# Patient Record
Sex: Female | Born: 1950
Health system: Southern US, Community
[De-identification: ages and names within clinical notes are randomized; demographics above are authoritative.]

## PROBLEM LIST (undated history)

## (undated) DIAGNOSIS — I10 Essential (primary) hypertension: Secondary | ICD-10-CM

## (undated) DIAGNOSIS — E119 Type 2 diabetes mellitus without complications: Secondary | ICD-10-CM

## (undated) HISTORY — PX: CHOLECYSTECTOMY: SHX55

---

## 2002-09-01 ENCOUNTER — Inpatient Hospital Stay (HOSPITAL_COMMUNITY): Admission: EM | Admit: 2002-09-01 | Discharge: 2002-09-02 | Payer: Self-pay | Admitting: Cardiology

## 2006-06-01 ENCOUNTER — Ambulatory Visit: Payer: Self-pay | Admitting: Cardiology

## 2006-06-07 ENCOUNTER — Ambulatory Visit: Payer: Self-pay | Admitting: Cardiology

## 2006-06-13 ENCOUNTER — Ambulatory Visit: Payer: Self-pay | Admitting: Cardiology

## 2006-06-21 ENCOUNTER — Ambulatory Visit: Payer: Self-pay | Admitting: Cardiology

## 2006-08-17 ENCOUNTER — Ambulatory Visit: Payer: Self-pay | Admitting: Cardiology

## 2006-08-21 ENCOUNTER — Emergency Department (HOSPITAL_COMMUNITY): Admission: EM | Admit: 2006-08-21 | Discharge: 2006-08-21 | Payer: Self-pay | Admitting: Family Medicine

## 2006-08-23 ENCOUNTER — Ambulatory Visit: Payer: Self-pay | Admitting: Cardiology

## 2006-08-29 ENCOUNTER — Ambulatory Visit: Payer: Self-pay | Admitting: Cardiology

## 2010-08-30 NOTE — Assessment & Plan Note (Signed)
[facility]                          [facility]   LYNEE, ROSENBACH                        MRN:          [id number]  DATE:08/23/2006                            DOB:          1950-12-04    PRIMARY CARDIOLOGIST:  Learta Codding, M.D.   REASON FOR VISIT:  Post hospital followup.   Sherri Reynolds is a 60 year old female recently seen by me here in the clinic  for post hospital followup on June 21, 2006, at which time I recommended  up-titration of Cardizem to 240 daily for better blood pressure control  as well as prophylaxis against recurrent atrial flutter.   Since then, the patient was hospitalized overnight here at [facility] when  she presented with reported profound hypotension in the field.  She was  discharged by Dr. Neita Carp after conferring informally with Dr. Andee Lineman and  the patient was instructed to start the Cardizem at the recommended dose  of 240 daily.  The patient had not yet started the increased dose of  Cardizem as I had previously recommended.  She tells me today, however,  that she did this for only 2 days but began feeling jittery all over  her body.  She therefore cut back to the previous dose of 120 daily.  She then had a somewhat similar episode just 2 days ago while she was at  work in [place].  She therefore took herself to the [facility] because she was concerned that she was having recurrent  hypotension.  She was found to have an initial blood pressure of 94/62  but a repeat reading only 5 minutes later was 158/88.  The patient tells  me today that she was not treated with any fluids and was subsequently  discharged.  Successive readings, which I have reviewed today, show  systolic readings in the 145-165 range with diastolics 90-100.   The patient tells me that she cut back on her lisinopril dose on her own  from 40 to 20 a day and has taken it in conjunction with the Cardizem  these past  two mornings.  Of the mornings of her episodes, however, she  had only taken Cardizem in the morning and the symptoms began some 2-3  hours later.  Of note, she thought that the initially episode was due to  hypoglycemia, but reportedly had a blood sugar of 157.   The patient has not had any tachypalpitations with these episodes.   CURRENT MEDICATIONS:  1. Cardizem 120 q.a.m.  2. Lisinopril 20 mg q.a.m.  3. Aspirin 81 daily.  4. Metformin 500 b.i.d.  5. Glipizide 10 b.i.d.  6. Lipitor 10 daily.  7. Actos 15 daily.   PHYSICAL EXAMINATION:  VITAL SIGNS:  Blood pressure 160/97; repeat  bilateral systolic in the 150-160 range.  Pulse 94, regular.  Weight  195.6.  GENERAL:  A 60 year old female, obese, sitting upright in no distress.  HEENT:  Normal.  NECK:  Palpable bilateral carotid pulses without bruits.  LUNGS:  Clear to auscultation.  HEART:  Regular rate and rhythm (S1,  S2).  No murmurs, rubs, or gallops.  ABDOMEN:  Protuberant, nontender.  EXTREMITIES:  No significant edema.  NEUROLOGIC:  No focal deficit.   IMPRESSION:  1. Recurrent hypotension, question secondary to dysautonomia.  2. History of typical atrial flutter.      a.     Quiescent on diltiazem.      b.     Negative recent CardioNet monitor.  3. Angiographically normal coronary angiogram, May 2004.  Negative      adenosine stress Cardiolite; ejection fraction 71%, February 2008.  4. Type 2 diabetes mellitus.  5. Hyperlipidemia.  6. Obesity.   PLAN:  Following discussion with Dr. Andee Lineman, the following  recommendations have been made.  We will check a D-dimer to exclude  pulmonary embolus as possible etiology for the hypertension, though we  feel that this is quite unlikely.  We will order 2-D echocardiogram to  rule out any structural abnormalities.  The patient has also been  advised to continue current medication regimen but to resume taking  lisinopril in the evening and to continue at the current dose of   diltiazem in the morning.   If the patient continues to have symptoms, we will consider referring  her to Dr. Sherryl Manges for evaluation of possible dysautonomia as the  etiology for her symptoms.   We will plan on having the patient return to our clinic to follow up  with me in 1 month for continued assessment and further recommendations.      Gene Serpe, PA-C       Learta Codding, MD,FACC    GS/MedQ  DD: 08/23/2006  DT: 08/23/2006  Job #: [id number]   cc:   Fara Chute

## 2010-09-02 NOTE — Assessment & Plan Note (Signed)
Sherri Reynolds                          [facility] NOTE   Sherri Reynolds, Sherri Reynolds                        MRN:          [id number]  DATE:06/21/2006                            DOB:          Dec 21, 1950    PRIMARY CARDIOLOGIST:  Dr. Andee Lineman.   REASON FOR VISIT:  Post hospital follow up.   Sherri Reynolds is a 60 year old female, with a history of normal coronary  arteries by cardiac catheterization in May 2004, and no previous history  of documented dysrhythmia, recently referred to [place] in consultation here  at [facility] for evaluation of documented SVT.   Patient was seen in consultation by Dr. Andee Lineman and a rhythm strip was  reviewed which was suggestive of atrial flutter (1:conduction) versus  AVNRT. Further workup as an outpatient was recommended and patient had  an adenosine stress Cardiolite as well as a CardioNet monitor. The  perfusion study was normal with a calculated ejection fraction of 71%.  CardioNet monitoring revealed several episodes of artifact, which were  corroborated by Dr. Sherryl Manges, thus revealing no true evidence of any  dysrhythmia.   Clinically, patient has reported no further pounding in her chest  after being discharged with the addition of Cardizem CD 120 q.d.   CURRENT MEDICATIONS:  1. Cardizem CD 120 q.d.  2. Aspirin 81 q.d.  3. Metformin 500 q.i.d.  4. Lipitor 10 q.d.  5. Lisinopril 40 q.d.  6. Toprol XL 50 q.d.  7. Glyburide 10 b.i.d.  8. Actos 15 q.d.   PHYSICAL EXAMINATION:  Blood pressure 177/102, pulse 74, regular, weight  204.  GENERAL: 60 year old female obese, sitting upright in no distress.  HEENT: Normocephalic, atraumatic.  NECK: Palpable bilateral carotid pulse without bruits.  LUNGS: Clear to auscultation all fields.  HEART: Regular rate and rhythm (S1, S2), no murmurs, rubs, or gallops.  ABDOMEN: Protuberant, nontender.  EXTREMITIES: No significant edema.  NEURO: No focal deficit.   IMPRESSION:  1. Supraventricular tachycardia.      a.     Suspect atrial flutter (1:conduction) versus AVNRT.      b.     Negative CardioNet monitor.      c.     Quiescent on diltiazem.  2. Atypical chest pain.      a.     Recent normal adenosine stress Cardiolite.      b.     Angiographically normal coronary arteries by cardiac       catheterization May 2004.  3. Preserved left ventricular function.  4. Uncontrolled hypertension.  5. Dyslipidemia.  6. Type 2 diabetes mellitus.  7. Obesity.   PLAN:  1. Up titrate Cardizem to 240 q.d. for better blood pressure control,      as well as conferring benefit as an antiarrhythmic. Accordingly, we      will taper off the Toprol given its relative infectiveness as an      antihypertensive as well as no clear indication for its use in      light of her normal coronary angiogram in 2004.  2. Schedule return clinic follow up with  myself and Dr. Andee Lineman in 6      months.      Gene Serpe, PA-C  Electronically Signed      Learta Codding, MD,FACC  Electronically Signed   GS/MedQ  DD: 06/21/2006  DT: 06/21/2006  Job #: [id number]   cc:   Donzetta Sprung

## 2010-09-02 NOTE — Discharge Summary (Signed)
[name], Sherri Reynolds                           ACCOUNT NO.:  [id number]   MEDICAL RECORD NO.:  [id number]                   PATIENT TYPE:  INP   LOCATION:  4743                                 FACILITY:  [facility]   PHYSICIAN:  [place] Bing, M.D.               DATE OF BIRTH:  05/24/1950   DATE OF ADMISSION:  09/01/2002  DATE OF DISCHARGE:  09/02/2002                                 DISCHARGE SUMMARY   DISCHARGE DIAGNOSES:  1. Chest pain, etiology unclear with cardiac catheterization this admission     revealing normal coronaries, normal left ventricular ejection fraction of     55%.  2. Hypertension.  3. Adult-onset diabetes mellitus.  4. Treated hyperlipidemia.  5. Obesity.  6. Normocytic anemia.   PROCEDURES:  Cardiac catheterization by Dr. Salvadore Farber on 09/02/2002.   HOSPITAL COURSE:  Please see the dictated consult note by Dr. Simona Huh  from [facility] on 09/01/2002 for complete details.  Briefly,  this is a 60 year old female who was transferred from [facility] with complaints of chest pain.  The patient had multiple risk  factors for coronary artery disease, and it was decided to proceed with  cardiac catheterization.  Her cardiac enzymes were negative at [facility].   She went to cardiac catheterization on 09/02/2002 by Dr. Samule Ohm.  This  revealed normal coronary arteries, normal systolic function.  Dr. Samule Ohm  recommended continued risk factor modification and followup with her primary  care physician.   The patient's laboratory work did reveal a normocytic anemia with a  hemoglobin of 10.9 and hematocrit 31.6 on 09/02/2002.  The patient is going  to refer to her primary care physician in the next 7 to 10 days.  She needs  a repeat CBC done to follow up on this.   LABORATORY DATA:  At [facility], glucose 205, BUN 9, creatinine 1,  calcium 9.3, sodium 140, potassium 3.8, chloride 107, CO2 28.  Cardiac  enzymes  negative x 3.  White count 9000, hemoglobin 12.4, hematocrit 36.3,  MCV 82.5, platelet count 321,000.  Triglycerides 213, total cholesterol 169,  HDL 43, LDL 83.   At [facility], white count 8300, hemoglobin 10.9, hematocrit 31.6,  platelet count 278,000.  INR 1.  Sodium 140, potassium 3.9, chloride 104,  CO2 30, glucose 174, BUN 8, creatinine 1.  Urine HCG negative.   Chest x-ray done at [facility] is still pending.   DISCHARGE MEDICATIONS:  1. Lipitor 5 mg q.h.s.  2. Glipizide XL 10 mg daily.  3. Lisinopril 10 mg daily.  4. Metformin 1000 mg twice daily, to be restarted on Friday, May 21.  5. Pain management with Tylenol as needed.   ACTIVITY:  No driving, heavy lifting, exertion, work, or sex for three days.  She is to slowly advance as tolerated.   DIET:  Low-fat, low-salt, diabetic  diet.   WOUND CARE:  The patient is to call our office in [facility] for any groin  swelling, bleeding, or bruising.   FOLLOW UP:  The patient is to follow up with her primary care physician, Dr.  Dimas Aguas, in the next 7 to 10 days.  She needs CBC checked on followup.  She  will need workup for anemia again with her primary care physician.     Tereso Newcomer, P.A.                        [place] Bing, M.D.    SW/MEDQ  D:  09/02/2002  T:  09/02/2002  Job:  [id number]   cc:   Dr. Dimas Aguas (phone [phone])   Jonelle Sidle, M.D. [facility]

## 2010-09-02 NOTE — Cardiovascular Report (Signed)
   NAMEJAILENE, Sherri Reynolds                           ACCOUNT NO.:  [id number]   MEDICAL RECORD NO.:  [id number]                   PATIENT TYPE:  INP   LOCATION:  4743                                 FACILITY:  [facility]   PHYSICIAN:  Salvadore Farber, M.D.             DATE OF BIRTH:  November 30, 1950   DATE OF PROCEDURE:  09/02/2002  DATE OF DISCHARGE:                              CARDIAC CATHETERIZATION   PROCEDURE:  Left heart catheterization, left ventriculography, coronary  angiography.   INDICATIONS:  The patient is a 60 year old lady with risk factors of  obesity, diabetes mellitus, hypertension, and dyslipidemia who presents with  five days of substernal chest discomfort radiating to her left arm.  She  ruled out for myocardial infarction.  Based on her substantial risk factors  she was referred for diagnostic angiography to exclude an ischemic etiology  to her chest discomfort.   DIAGNOSTIC TECHNIQUE:  Informed consent was obtained.  Under 1% lidocaine  local anesthesia a 6 French sheath was placed in the right femoral artery  using the modified Seldinger technique.  Diagnostic angiography and  ventriculography were performed using JL4, JR4, and pigtail catheters.  The  patient tolerated the procedure well and was transferred to the holding room  in stable condition.   COMPLICATIONS:  None.   FINDINGS:  1. LV:  143/13/16.  EF 55% without regional wall motion abnormality.  2. No aortic stenosis or mitral regurgitation.  3. Left main:  Angiographically normal.  4. LAD:  The LAD is a moderate sized vessel giving rise to three diagonal     branches.  It is angiographically normal.  5. Circumflex:  The circumflex is a moderate sized, dominant vessel giving     rise to a single obtuse marginal branch in the PDA.  It is     angiographically normal.  6. RCA:  The RCA is a large, but nondominant vessel supplying substantial     portion of the posterior wall.  It is angiographically  normal.   IMPRESSION/RECOMMENDATIONS:  1. Angiographically normal coronary arteries.  2.     Normal left ventricular size and systolic function.  3. No aortic stenosis or mitral regurgitation.   Suspect noncardiac etiology to her chest discomfort.  Recommend continued  aggressive risk factor modification.                                               Salvadore Farber, M.D.    WED/MEDQ  D:  09/02/2002  T:  09/02/2002  Job:  [id number]   cc:   Cleophus Molt  [address] [place]  [place] [address]  Fax: [phone]   Jonelle Sidle, M.D. [facility]

## 2015-04-20 DIAGNOSIS — I10 Essential (primary) hypertension: Secondary | ICD-10-CM | POA: Diagnosis not present

## 2015-04-20 DIAGNOSIS — L719 Rosacea, unspecified: Secondary | ICD-10-CM | POA: Diagnosis not present

## 2015-04-20 DIAGNOSIS — E782 Mixed hyperlipidemia: Secondary | ICD-10-CM | POA: Diagnosis not present

## 2015-04-20 DIAGNOSIS — R300 Dysuria: Secondary | ICD-10-CM | POA: Diagnosis not present

## 2015-04-20 DIAGNOSIS — E1165 Type 2 diabetes mellitus with hyperglycemia: Secondary | ICD-10-CM | POA: Diagnosis not present

## 2015-10-13 DIAGNOSIS — L259 Unspecified contact dermatitis, unspecified cause: Secondary | ICD-10-CM | POA: Diagnosis not present

## 2015-10-13 DIAGNOSIS — L309 Dermatitis, unspecified: Secondary | ICD-10-CM | POA: Diagnosis not present

## 2015-10-13 DIAGNOSIS — D485 Neoplasm of uncertain behavior of skin: Secondary | ICD-10-CM | POA: Diagnosis not present

## 2015-10-15 DIAGNOSIS — E1165 Type 2 diabetes mellitus with hyperglycemia: Secondary | ICD-10-CM | POA: Diagnosis not present

## 2015-10-15 DIAGNOSIS — E782 Mixed hyperlipidemia: Secondary | ICD-10-CM | POA: Diagnosis not present

## 2015-10-15 DIAGNOSIS — I10 Essential (primary) hypertension: Secondary | ICD-10-CM | POA: Diagnosis not present

## 2015-10-21 DIAGNOSIS — E1165 Type 2 diabetes mellitus with hyperglycemia: Secondary | ICD-10-CM | POA: Diagnosis not present

## 2015-10-21 DIAGNOSIS — Z6832 Body mass index (BMI) 32.0-32.9, adult: Secondary | ICD-10-CM | POA: Diagnosis not present

## 2015-10-21 DIAGNOSIS — L719 Rosacea, unspecified: Secondary | ICD-10-CM | POA: Diagnosis not present

## 2015-10-21 DIAGNOSIS — I10 Essential (primary) hypertension: Secondary | ICD-10-CM | POA: Diagnosis not present

## 2015-10-21 DIAGNOSIS — E782 Mixed hyperlipidemia: Secondary | ICD-10-CM | POA: Diagnosis not present

## 2015-12-14 DIAGNOSIS — Z1231 Encounter for screening mammogram for malignant neoplasm of breast: Secondary | ICD-10-CM | POA: Diagnosis not present

## 2016-04-21 DIAGNOSIS — I10 Essential (primary) hypertension: Secondary | ICD-10-CM | POA: Diagnosis not present

## 2016-04-21 DIAGNOSIS — E782 Mixed hyperlipidemia: Secondary | ICD-10-CM | POA: Diagnosis not present

## 2016-04-21 DIAGNOSIS — E1165 Type 2 diabetes mellitus with hyperglycemia: Secondary | ICD-10-CM | POA: Diagnosis not present

## 2016-04-25 DIAGNOSIS — Z23 Encounter for immunization: Secondary | ICD-10-CM | POA: Diagnosis not present

## 2016-04-25 DIAGNOSIS — E782 Mixed hyperlipidemia: Secondary | ICD-10-CM | POA: Diagnosis not present

## 2016-04-25 DIAGNOSIS — Z0000 Encounter for general adult medical examination without abnormal findings: Secondary | ICD-10-CM | POA: Diagnosis not present

## 2016-04-25 DIAGNOSIS — E1165 Type 2 diabetes mellitus with hyperglycemia: Secondary | ICD-10-CM | POA: Diagnosis not present

## 2016-04-25 DIAGNOSIS — Z6831 Body mass index (BMI) 31.0-31.9, adult: Secondary | ICD-10-CM | POA: Diagnosis not present

## 2016-04-25 DIAGNOSIS — I10 Essential (primary) hypertension: Secondary | ICD-10-CM | POA: Diagnosis not present

## 2016-06-08 DIAGNOSIS — R69 Illness, unspecified: Secondary | ICD-10-CM | POA: Diagnosis not present

## 2016-07-13 DIAGNOSIS — D122 Benign neoplasm of ascending colon: Secondary | ICD-10-CM | POA: Diagnosis not present

## 2016-07-13 DIAGNOSIS — E7800 Pure hypercholesterolemia, unspecified: Secondary | ICD-10-CM | POA: Diagnosis not present

## 2016-07-13 DIAGNOSIS — Z1211 Encounter for screening for malignant neoplasm of colon: Secondary | ICD-10-CM | POA: Diagnosis not present

## 2016-07-13 DIAGNOSIS — E785 Hyperlipidemia, unspecified: Secondary | ICD-10-CM | POA: Diagnosis not present

## 2016-07-13 DIAGNOSIS — K635 Polyp of colon: Secondary | ICD-10-CM | POA: Diagnosis not present

## 2016-07-13 DIAGNOSIS — E119 Type 2 diabetes mellitus without complications: Secondary | ICD-10-CM | POA: Diagnosis not present

## 2016-07-13 DIAGNOSIS — I10 Essential (primary) hypertension: Secondary | ICD-10-CM | POA: Diagnosis not present

## 2016-08-10 DIAGNOSIS — D2330 Other benign neoplasm of skin of unspecified part of face: Secondary | ICD-10-CM | POA: Diagnosis not present

## 2016-08-10 DIAGNOSIS — Z6832 Body mass index (BMI) 32.0-32.9, adult: Secondary | ICD-10-CM | POA: Diagnosis not present

## 2016-08-10 DIAGNOSIS — M25512 Pain in left shoulder: Secondary | ICD-10-CM | POA: Diagnosis not present

## 2016-08-23 DIAGNOSIS — L280 Lichen simplex chronicus: Secondary | ICD-10-CM | POA: Diagnosis not present

## 2016-08-23 DIAGNOSIS — D485 Neoplasm of uncertain behavior of skin: Secondary | ICD-10-CM | POA: Diagnosis not present

## 2016-08-23 DIAGNOSIS — L404 Guttate psoriasis: Secondary | ICD-10-CM | POA: Diagnosis not present

## 2016-09-12 DIAGNOSIS — R69 Illness, unspecified: Secondary | ICD-10-CM | POA: Diagnosis not present

## 2016-09-14 ENCOUNTER — Ambulatory Visit (INDEPENDENT_AMBULATORY_CARE_PROVIDER_SITE_OTHER): Payer: Medicare HMO | Admitting: Otolaryngology

## 2016-09-14 DIAGNOSIS — D37030 Neoplasm of uncertain behavior of the parotid salivary glands: Secondary | ICD-10-CM

## 2016-09-22 ENCOUNTER — Other Ambulatory Visit (INDEPENDENT_AMBULATORY_CARE_PROVIDER_SITE_OTHER): Payer: Self-pay | Admitting: Otolaryngology

## 2016-09-22 DIAGNOSIS — K118 Other diseases of salivary glands: Secondary | ICD-10-CM

## 2016-09-28 DIAGNOSIS — L570 Actinic keratosis: Secondary | ICD-10-CM | POA: Diagnosis not present

## 2016-09-28 DIAGNOSIS — L280 Lichen simplex chronicus: Secondary | ICD-10-CM | POA: Diagnosis not present

## 2016-10-09 ENCOUNTER — Ambulatory Visit (HOSPITAL_COMMUNITY)
Admission: RE | Admit: 2016-10-09 | Discharge: 2016-10-09 | Disposition: A | Discharge status: Home / Self Care | Payer: Medicare HMO | Source: Ambulatory Visit | Attending: Otolaryngology | Admitting: Otolaryngology

## 2016-10-09 ENCOUNTER — Encounter (HOSPITAL_COMMUNITY): Payer: Self-pay | Admitting: Radiology

## 2016-10-09 DIAGNOSIS — K118 Other diseases of salivary glands: Secondary | ICD-10-CM | POA: Diagnosis not present

## 2016-10-09 DIAGNOSIS — K119 Disease of salivary gland, unspecified: Secondary | ICD-10-CM | POA: Diagnosis present

## 2016-10-09 LAB — POCT I-STAT CREATININE: Creatinine, Ser: 0.8 mg/dL (ref 0.44–1.00)

## 2016-10-09 MED ORDER — IOPAMIDOL (ISOVUE-300) INJECTION 61%
75.0000 mL | Freq: Once | INTRAVENOUS | Status: AC | PRN
  Administered 2016-10-09: 75 mL via INTRAVENOUS

## 2016-10-10 DIAGNOSIS — E782 Mixed hyperlipidemia: Secondary | ICD-10-CM | POA: Diagnosis not present

## 2016-10-10 DIAGNOSIS — E1165 Type 2 diabetes mellitus with hyperglycemia: Secondary | ICD-10-CM | POA: Diagnosis not present

## 2016-10-10 DIAGNOSIS — I10 Essential (primary) hypertension: Secondary | ICD-10-CM | POA: Diagnosis not present

## 2016-10-10 DIAGNOSIS — L719 Rosacea, unspecified: Secondary | ICD-10-CM | POA: Diagnosis not present

## 2016-10-13 DIAGNOSIS — I10 Essential (primary) hypertension: Secondary | ICD-10-CM | POA: Diagnosis not present

## 2016-10-13 DIAGNOSIS — E782 Mixed hyperlipidemia: Secondary | ICD-10-CM | POA: Diagnosis not present

## 2016-10-13 DIAGNOSIS — E1165 Type 2 diabetes mellitus with hyperglycemia: Secondary | ICD-10-CM | POA: Diagnosis not present

## 2016-10-13 DIAGNOSIS — L719 Rosacea, unspecified: Secondary | ICD-10-CM | POA: Diagnosis not present

## 2016-10-13 DIAGNOSIS — Z6833 Body mass index (BMI) 33.0-33.9, adult: Secondary | ICD-10-CM | POA: Diagnosis not present

## 2016-10-13 DIAGNOSIS — D2330 Other benign neoplasm of skin of unspecified part of face: Secondary | ICD-10-CM | POA: Diagnosis not present

## 2016-10-21 DIAGNOSIS — Z6833 Body mass index (BMI) 33.0-33.9, adult: Secondary | ICD-10-CM | POA: Diagnosis not present

## 2016-10-21 DIAGNOSIS — Z794 Long term (current) use of insulin: Secondary | ICD-10-CM | POA: Diagnosis not present

## 2016-10-21 DIAGNOSIS — E669 Obesity, unspecified: Secondary | ICD-10-CM | POA: Diagnosis not present

## 2016-10-21 DIAGNOSIS — K5900 Constipation, unspecified: Secondary | ICD-10-CM | POA: Diagnosis not present

## 2016-10-21 DIAGNOSIS — Z0000 Encounter for general adult medical examination without abnormal findings: Secondary | ICD-10-CM | POA: Diagnosis not present

## 2016-10-21 DIAGNOSIS — Z79899 Other long term (current) drug therapy: Secondary | ICD-10-CM | POA: Diagnosis not present

## 2016-10-21 DIAGNOSIS — E119 Type 2 diabetes mellitus without complications: Secondary | ICD-10-CM | POA: Diagnosis not present

## 2016-10-21 DIAGNOSIS — I10 Essential (primary) hypertension: Secondary | ICD-10-CM | POA: Diagnosis not present

## 2016-10-21 DIAGNOSIS — E782 Mixed hyperlipidemia: Secondary | ICD-10-CM | POA: Diagnosis not present

## 2016-10-21 DIAGNOSIS — Z7982 Long term (current) use of aspirin: Secondary | ICD-10-CM | POA: Diagnosis not present

## 2016-11-22 DIAGNOSIS — R69 Illness, unspecified: Secondary | ICD-10-CM | POA: Diagnosis not present

## 2017-01-10 DIAGNOSIS — R69 Illness, unspecified: Secondary | ICD-10-CM | POA: Diagnosis not present

## 2017-01-12 DIAGNOSIS — Z1231 Encounter for screening mammogram for malignant neoplasm of breast: Secondary | ICD-10-CM | POA: Diagnosis not present

## 2017-01-16 DIAGNOSIS — R69 Illness, unspecified: Secondary | ICD-10-CM | POA: Diagnosis not present

## 2017-02-02 DIAGNOSIS — E782 Mixed hyperlipidemia: Secondary | ICD-10-CM | POA: Diagnosis not present

## 2017-02-02 DIAGNOSIS — D2330 Other benign neoplasm of skin of unspecified part of face: Secondary | ICD-10-CM | POA: Diagnosis not present

## 2017-02-02 DIAGNOSIS — I10 Essential (primary) hypertension: Secondary | ICD-10-CM | POA: Diagnosis not present

## 2017-02-02 DIAGNOSIS — E1165 Type 2 diabetes mellitus with hyperglycemia: Secondary | ICD-10-CM | POA: Diagnosis not present

## 2017-02-07 DIAGNOSIS — Z6835 Body mass index (BMI) 35.0-35.9, adult: Secondary | ICD-10-CM | POA: Diagnosis not present

## 2017-02-07 DIAGNOSIS — L719 Rosacea, unspecified: Secondary | ICD-10-CM | POA: Diagnosis not present

## 2017-02-07 DIAGNOSIS — E782 Mixed hyperlipidemia: Secondary | ICD-10-CM | POA: Diagnosis not present

## 2017-02-07 DIAGNOSIS — R946 Abnormal results of thyroid function studies: Secondary | ICD-10-CM | POA: Diagnosis not present

## 2017-02-07 DIAGNOSIS — I10 Essential (primary) hypertension: Secondary | ICD-10-CM | POA: Diagnosis not present

## 2017-02-07 DIAGNOSIS — E1165 Type 2 diabetes mellitus with hyperglycemia: Secondary | ICD-10-CM | POA: Diagnosis not present

## 2017-03-05 DIAGNOSIS — R69 Illness, unspecified: Secondary | ICD-10-CM | POA: Diagnosis not present

## 2017-04-23 ENCOUNTER — Ambulatory Visit (INDEPENDENT_AMBULATORY_CARE_PROVIDER_SITE_OTHER): Payer: Medicare HMO | Admitting: Otolaryngology

## 2017-04-23 ENCOUNTER — Other Ambulatory Visit (INDEPENDENT_AMBULATORY_CARE_PROVIDER_SITE_OTHER): Payer: Self-pay | Admitting: Otolaryngology

## 2017-04-23 DIAGNOSIS — D440 Neoplasm of uncertain behavior of thyroid gland: Secondary | ICD-10-CM | POA: Diagnosis not present

## 2017-04-23 DIAGNOSIS — E041 Nontoxic single thyroid nodule: Secondary | ICD-10-CM

## 2017-04-24 ENCOUNTER — Other Ambulatory Visit (INDEPENDENT_AMBULATORY_CARE_PROVIDER_SITE_OTHER): Payer: Self-pay | Admitting: Otolaryngology

## 2017-04-24 DIAGNOSIS — E041 Nontoxic single thyroid nodule: Secondary | ICD-10-CM

## 2017-04-27 ENCOUNTER — Ambulatory Visit (HOSPITAL_COMMUNITY)
Admission: RE | Admit: 2017-04-27 | Discharge: 2017-04-27 | Disposition: A | Discharge status: Home / Self Care | Payer: Medicare HMO | Source: Ambulatory Visit | Attending: Otolaryngology | Admitting: Otolaryngology

## 2017-04-27 DIAGNOSIS — E042 Nontoxic multinodular goiter: Secondary | ICD-10-CM | POA: Insufficient documentation

## 2017-04-27 DIAGNOSIS — E041 Nontoxic single thyroid nodule: Secondary | ICD-10-CM | POA: Diagnosis not present

## 2017-04-30 DIAGNOSIS — R69 Illness, unspecified: Secondary | ICD-10-CM | POA: Diagnosis not present

## 2017-05-02 ENCOUNTER — Encounter (HOSPITAL_COMMUNITY): Payer: Self-pay

## 2017-05-02 ENCOUNTER — Ambulatory Visit (HOSPITAL_COMMUNITY)
Admission: RE | Admit: 2017-05-02 | Discharge: 2017-05-02 | Disposition: A | Discharge status: Home / Self Care | Payer: Medicare HMO | Source: Ambulatory Visit | Attending: Otolaryngology | Admitting: Otolaryngology

## 2017-05-02 DIAGNOSIS — E041 Nontoxic single thyroid nodule: Secondary | ICD-10-CM | POA: Insufficient documentation

## 2017-05-02 HISTORY — DX: Type 2 diabetes mellitus without complications: E11.9

## 2017-05-02 HISTORY — DX: Essential (primary) hypertension: I10

## 2017-05-02 MED ORDER — LIDOCAINE HCL (PF) 2 % IJ SOLN
INTRAMUSCULAR | Status: AC
  Administered 2017-05-02: 5 mL
  Filled 2017-05-02: qty 10

## 2017-05-02 MED ORDER — LIDOCAINE HCL (PF) 2 % IJ SOLN
INTRAMUSCULAR | Status: AC
  Filled 2017-05-02: qty 10

## 2017-05-02 NOTE — Discharge Instructions (Signed)
Thyroid Biopsy °The thyroid gland is a butterfly-shaped gland located in the front of the neck. It produces hormones that affect metabolism, growth and development, and body temperature. Thyroid biopsy is a procedure in which small samples of tissue or fluid are removed from the thyroid gland. The samples are then looked at under a microscope to check for abnormalities. This procedure is done to determine the cause of thyroid problems. It may be done to check for infection, cancer, or other thyroid problems. °Two methods may be used for a thyroid biopsy. In one method, a thin needle is inserted through the skin and into the thyroid gland. In the other method, an open incision is made through the skin. °Tell a health care provider about: °· Any allergies you have. °· All medicines you are taking, including vitamins, herbs, eye drops, creams, and over-the-counter medicines. °· Any problems you or family members have had with anesthetic medicines. °· Any blood disorders you have. °· Any surgeries you have had. °· Any medical conditions you have. °What are the risks? °Generally, this is a safe procedure. However, problems can occur and include: °· Bleeding from the procedure site. °· Infection. °· Injury to structures near the thyroid gland. ° °What happens before the procedure? °· Ask your health care provider about: °? Changing or stopping your regular medicines. This is especially important if you are taking diabetes medicines or blood thinners. °? Taking medicines such as aspirin and ibuprofen. These medicines can thin your blood. Do not take these medicines before your procedure if your health care provider asks you not to. °· Do not eat or drink anything after midnight on the night before the procedure or as directed by your health care provider. °· You may have a blood sample taken. °What happens during the procedure? °Either of these methods may be used to perform a thyroid biopsy: °· Fine needle biopsy. You may  be given medicine to help you relax (sedative). You will be asked to lie on your back with your head tipped backward to extend your neck. An area on your neck will be cleaned. A needle will then be inserted through the skin of your neck. You may be asked to avoid coughing, talking, swallowing, or making sounds during some portions of the procedure. The needle will be withdrawn once the tissue or fluid samples have been removed. Pressure may be applied to your neck to reduce swelling and ensure that bleeding has stopped. The samples will be sent to a lab for examination. °· Open biopsy. You will be given medicine to make you sleep (general anesthetic). An incision will be made in your neck. A sample of thyroid tissue will be removed using surgical tools. The tissue sample will be sent for examination. In some cases, the sample may be examined during the biopsy. If that is done and cancer cells are found, some or all of the thyroid gland may be removed. The incision will be closed with stitches. ° °What happens after the procedure? °· Your recovery will be assessed and monitored. °· You may have soreness and tenderness at the site of the biopsy. This should go away after a few days. °· If you had an open biopsy, you may have a hoarse voice or sore throat for a couple days. °· It is your responsibility to get your test results. °This information is not intended to replace advice given to you by your health care provider. Make sure you discuss any questions you have with   your health care provider. °Document Released: 01/29/2007 Document Revised: 12/05/2015 Document Reviewed: 06/26/2013 °Elsevier Interactive Patient Education © 2018 [employer] ° °

## 2017-05-17 DIAGNOSIS — E1165 Type 2 diabetes mellitus with hyperglycemia: Secondary | ICD-10-CM | POA: Diagnosis not present

## 2017-05-17 DIAGNOSIS — I10 Essential (primary) hypertension: Secondary | ICD-10-CM | POA: Diagnosis not present

## 2017-05-17 DIAGNOSIS — E782 Mixed hyperlipidemia: Secondary | ICD-10-CM | POA: Diagnosis not present

## 2017-05-17 DIAGNOSIS — R946 Abnormal results of thyroid function studies: Secondary | ICD-10-CM | POA: Diagnosis not present

## 2017-05-21 DIAGNOSIS — Z23 Encounter for immunization: Secondary | ICD-10-CM | POA: Diagnosis not present

## 2017-05-21 DIAGNOSIS — E1165 Type 2 diabetes mellitus with hyperglycemia: Secondary | ICD-10-CM | POA: Diagnosis not present

## 2017-05-21 DIAGNOSIS — L719 Rosacea, unspecified: Secondary | ICD-10-CM | POA: Diagnosis not present

## 2017-05-21 DIAGNOSIS — Z0000 Encounter for general adult medical examination without abnormal findings: Secondary | ICD-10-CM | POA: Diagnosis not present

## 2017-05-21 DIAGNOSIS — E782 Mixed hyperlipidemia: Secondary | ICD-10-CM | POA: Diagnosis not present

## 2017-05-21 DIAGNOSIS — R946 Abnormal results of thyroid function studies: Secondary | ICD-10-CM | POA: Diagnosis not present

## 2017-05-21 DIAGNOSIS — I10 Essential (primary) hypertension: Secondary | ICD-10-CM | POA: Diagnosis not present

## 2017-05-21 DIAGNOSIS — Z6833 Body mass index (BMI) 33.0-33.9, adult: Secondary | ICD-10-CM | POA: Diagnosis not present

## 2017-07-02 DIAGNOSIS — R69 Illness, unspecified: Secondary | ICD-10-CM | POA: Diagnosis not present

## 2017-08-20 DIAGNOSIS — R69 Illness, unspecified: Secondary | ICD-10-CM | POA: Diagnosis not present

## 2017-09-17 DIAGNOSIS — E1165 Type 2 diabetes mellitus with hyperglycemia: Secondary | ICD-10-CM | POA: Diagnosis not present

## 2017-09-17 DIAGNOSIS — R946 Abnormal results of thyroid function studies: Secondary | ICD-10-CM | POA: Diagnosis not present

## 2017-09-17 DIAGNOSIS — E782 Mixed hyperlipidemia: Secondary | ICD-10-CM | POA: Diagnosis not present

## 2017-09-17 DIAGNOSIS — I10 Essential (primary) hypertension: Secondary | ICD-10-CM | POA: Diagnosis not present

## 2017-09-20 DIAGNOSIS — R946 Abnormal results of thyroid function studies: Secondary | ICD-10-CM | POA: Diagnosis not present

## 2017-09-20 DIAGNOSIS — I10 Essential (primary) hypertension: Secondary | ICD-10-CM | POA: Diagnosis not present

## 2017-09-20 DIAGNOSIS — E782 Mixed hyperlipidemia: Secondary | ICD-10-CM | POA: Diagnosis not present

## 2017-09-20 DIAGNOSIS — E1165 Type 2 diabetes mellitus with hyperglycemia: Secondary | ICD-10-CM | POA: Diagnosis not present

## 2017-09-20 DIAGNOSIS — N6324 Unspecified lump in the left breast, lower inner quadrant: Secondary | ICD-10-CM | POA: Diagnosis not present

## 2017-09-20 DIAGNOSIS — Z6834 Body mass index (BMI) 34.0-34.9, adult: Secondary | ICD-10-CM | POA: Diagnosis not present

## 2017-09-20 DIAGNOSIS — L719 Rosacea, unspecified: Secondary | ICD-10-CM | POA: Diagnosis not present

## 2017-09-25 DIAGNOSIS — Z6833 Body mass index (BMI) 33.0-33.9, adult: Secondary | ICD-10-CM | POA: Diagnosis not present

## 2017-09-25 DIAGNOSIS — J019 Acute sinusitis, unspecified: Secondary | ICD-10-CM | POA: Diagnosis not present

## 2017-10-01 DIAGNOSIS — L280 Lichen simplex chronicus: Secondary | ICD-10-CM | POA: Diagnosis not present

## 2017-10-01 DIAGNOSIS — L570 Actinic keratosis: Secondary | ICD-10-CM | POA: Diagnosis not present

## 2017-10-10 DIAGNOSIS — N6001 Solitary cyst of right breast: Secondary | ICD-10-CM | POA: Diagnosis not present

## 2017-10-10 DIAGNOSIS — R928 Other abnormal and inconclusive findings on diagnostic imaging of breast: Secondary | ICD-10-CM | POA: Diagnosis not present

## 2017-10-10 DIAGNOSIS — N6002 Solitary cyst of left breast: Secondary | ICD-10-CM | POA: Diagnosis not present

## 2017-10-11 DIAGNOSIS — R69 Illness, unspecified: Secondary | ICD-10-CM | POA: Diagnosis not present

## 2017-11-27 DIAGNOSIS — R69 Illness, unspecified: Secondary | ICD-10-CM | POA: Diagnosis not present

## 2018-01-07 DIAGNOSIS — R69 Illness, unspecified: Secondary | ICD-10-CM | POA: Diagnosis not present

## 2018-01-15 DIAGNOSIS — R946 Abnormal results of thyroid function studies: Secondary | ICD-10-CM | POA: Diagnosis not present

## 2018-01-15 DIAGNOSIS — E1165 Type 2 diabetes mellitus with hyperglycemia: Secondary | ICD-10-CM | POA: Diagnosis not present

## 2018-01-15 DIAGNOSIS — I10 Essential (primary) hypertension: Secondary | ICD-10-CM | POA: Diagnosis not present

## 2018-01-15 DIAGNOSIS — E782 Mixed hyperlipidemia: Secondary | ICD-10-CM | POA: Diagnosis not present

## 2018-01-18 DIAGNOSIS — I10 Essential (primary) hypertension: Secondary | ICD-10-CM | POA: Diagnosis not present

## 2018-01-18 DIAGNOSIS — Z23 Encounter for immunization: Secondary | ICD-10-CM | POA: Diagnosis not present

## 2018-01-18 DIAGNOSIS — Z6833 Body mass index (BMI) 33.0-33.9, adult: Secondary | ICD-10-CM | POA: Diagnosis not present

## 2018-01-18 DIAGNOSIS — L719 Rosacea, unspecified: Secondary | ICD-10-CM | POA: Diagnosis not present

## 2018-01-18 DIAGNOSIS — E782 Mixed hyperlipidemia: Secondary | ICD-10-CM | POA: Diagnosis not present

## 2018-01-18 DIAGNOSIS — E1165 Type 2 diabetes mellitus with hyperglycemia: Secondary | ICD-10-CM | POA: Diagnosis not present

## 2018-01-18 DIAGNOSIS — R946 Abnormal results of thyroid function studies: Secondary | ICD-10-CM | POA: Diagnosis not present

## 2018-03-11 DIAGNOSIS — R69 Illness, unspecified: Secondary | ICD-10-CM | POA: Diagnosis not present

## 2018-03-19 DIAGNOSIS — R69 Illness, unspecified: Secondary | ICD-10-CM | POA: Diagnosis not present

## 2018-04-18 DIAGNOSIS — R69 Illness, unspecified: Secondary | ICD-10-CM | POA: Diagnosis not present

## 2018-04-24 DIAGNOSIS — Z7984 Long term (current) use of oral hypoglycemic drugs: Secondary | ICD-10-CM | POA: Diagnosis not present

## 2018-04-24 DIAGNOSIS — H353131 Nonexudative age-related macular degeneration, bilateral, early dry stage: Secondary | ICD-10-CM | POA: Diagnosis not present

## 2018-04-24 DIAGNOSIS — E119 Type 2 diabetes mellitus without complications: Secondary | ICD-10-CM | POA: Diagnosis not present

## 2018-04-24 DIAGNOSIS — Z794 Long term (current) use of insulin: Secondary | ICD-10-CM | POA: Diagnosis not present

## 2018-04-29 ENCOUNTER — Other Ambulatory Visit: Payer: Self-pay | Admitting: Otolaryngology

## 2018-04-29 ENCOUNTER — Ambulatory Visit (INDEPENDENT_AMBULATORY_CARE_PROVIDER_SITE_OTHER): Payer: Medicare HMO | Admitting: Otolaryngology

## 2018-04-29 DIAGNOSIS — E041 Nontoxic single thyroid nodule: Secondary | ICD-10-CM

## 2018-04-29 DIAGNOSIS — D440 Neoplasm of uncertain behavior of thyroid gland: Secondary | ICD-10-CM | POA: Diagnosis not present

## 2018-05-07 ENCOUNTER — Ambulatory Visit (HOSPITAL_COMMUNITY)
Admission: RE | Admit: 2018-05-07 | Discharge: 2018-05-07 | Disposition: A | Discharge status: Home / Self Care | Payer: Medicare HMO | Source: Ambulatory Visit | Attending: Otolaryngology | Admitting: Otolaryngology

## 2018-05-07 DIAGNOSIS — E041 Nontoxic single thyroid nodule: Secondary | ICD-10-CM | POA: Diagnosis not present

## 2018-05-07 DIAGNOSIS — E042 Nontoxic multinodular goiter: Secondary | ICD-10-CM | POA: Diagnosis not present

## 2018-05-20 DIAGNOSIS — I10 Essential (primary) hypertension: Secondary | ICD-10-CM | POA: Diagnosis not present

## 2018-05-20 DIAGNOSIS — R946 Abnormal results of thyroid function studies: Secondary | ICD-10-CM | POA: Diagnosis not present

## 2018-05-20 DIAGNOSIS — E1165 Type 2 diabetes mellitus with hyperglycemia: Secondary | ICD-10-CM | POA: Diagnosis not present

## 2018-05-20 DIAGNOSIS — E782 Mixed hyperlipidemia: Secondary | ICD-10-CM | POA: Diagnosis not present

## 2018-05-23 DIAGNOSIS — Z0000 Encounter for general adult medical examination without abnormal findings: Secondary | ICD-10-CM | POA: Diagnosis not present

## 2018-05-23 DIAGNOSIS — I10 Essential (primary) hypertension: Secondary | ICD-10-CM | POA: Diagnosis not present

## 2018-05-23 DIAGNOSIS — Z6833 Body mass index (BMI) 33.0-33.9, adult: Secondary | ICD-10-CM | POA: Diagnosis not present

## 2018-06-10 DIAGNOSIS — R69 Illness, unspecified: Secondary | ICD-10-CM | POA: Diagnosis not present

## 2018-08-02 DIAGNOSIS — R69 Illness, unspecified: Secondary | ICD-10-CM | POA: Diagnosis not present

## 2018-08-22 IMAGING — CT CT NECK W/ CM
3 of 4 series · 13 of 33 positions shown, 16 images · IV contrast (iopamidol)
Comparison: None.

CLINICAL DATA: Left parotid mass

EXAM:
CT NECK WITH CONTRAST
TECHNIQUE: Multidetector CT imaging of the neck was performed using the
standard protocol following the bolus administration of intravenous
contrast.
CONTRAST:  75mL 03OA6W-XAA IOPAMIDOL (03OA6W-XAA) INJECTION 61%

[Series 2: axial neck · axial · 0.40mm/px · z∈[+1261,+1413]mm · 5 of 115 slices shown, 7 images]
[im 20/115  soft-tissue]
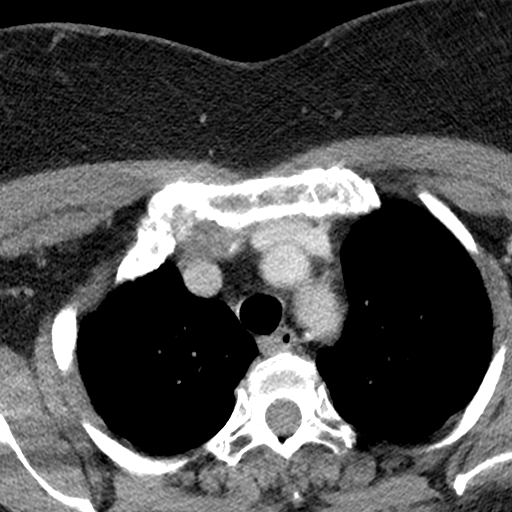
[im 20/115  bone]
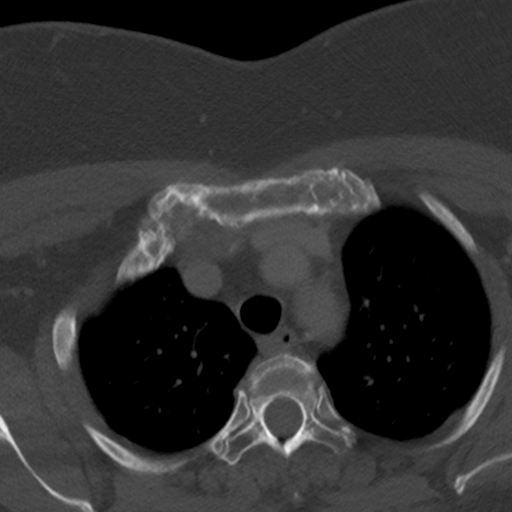
[im 39/115  bone]
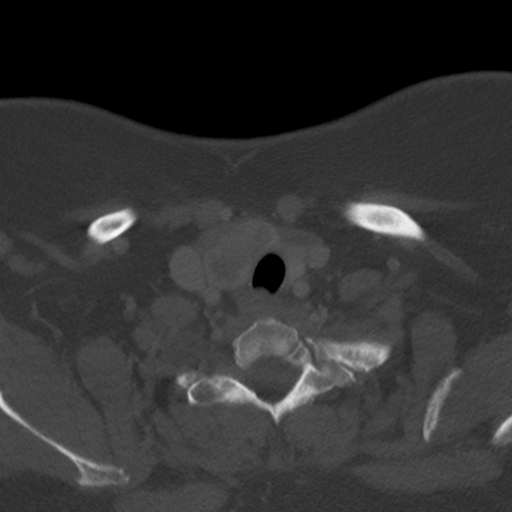
[im 58/115  bone]
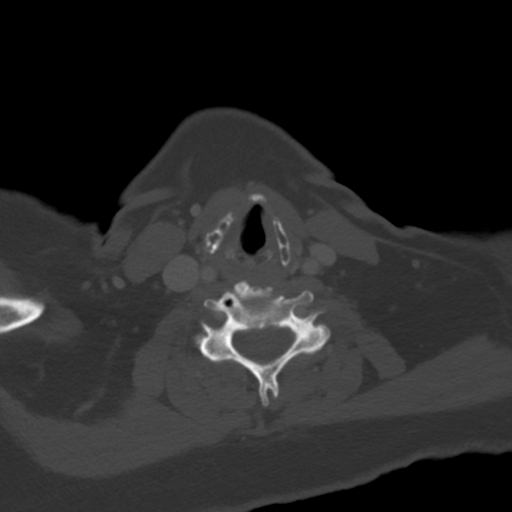
[im 77/115  bone]
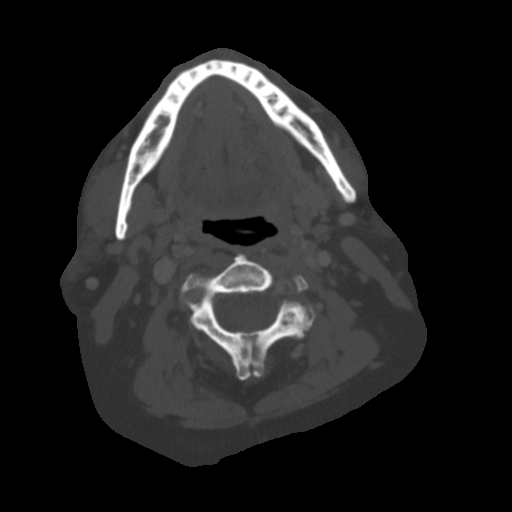
[im 96/115  soft-tissue]
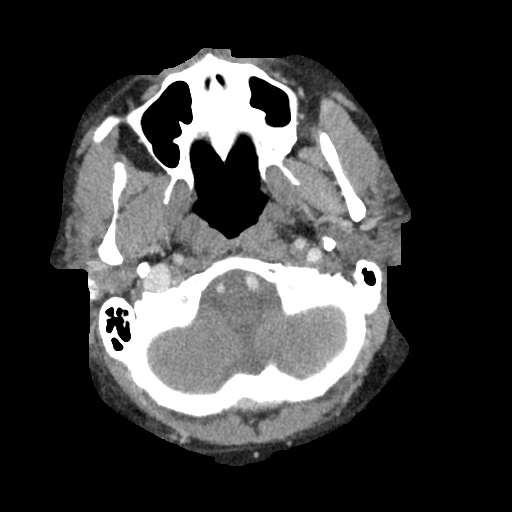
[im 96/115  bone]
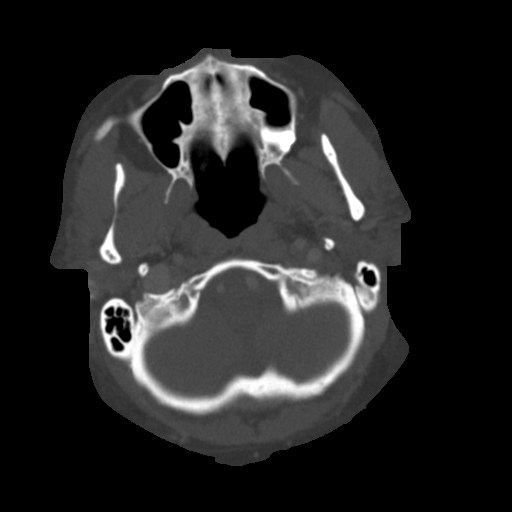

[Series 6: coronal neck · coronal · 0.37mm/px · 3 of 98 slices shown]
[im 20/98  bone]
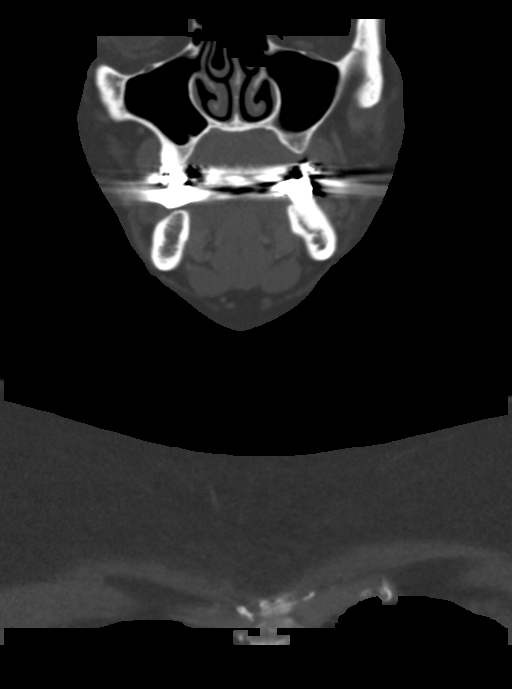
[im 39/98  bone]
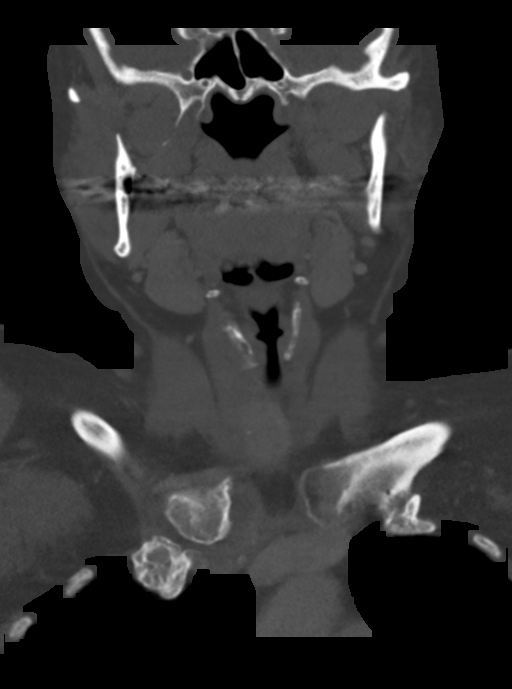
[im 59/98  bone]
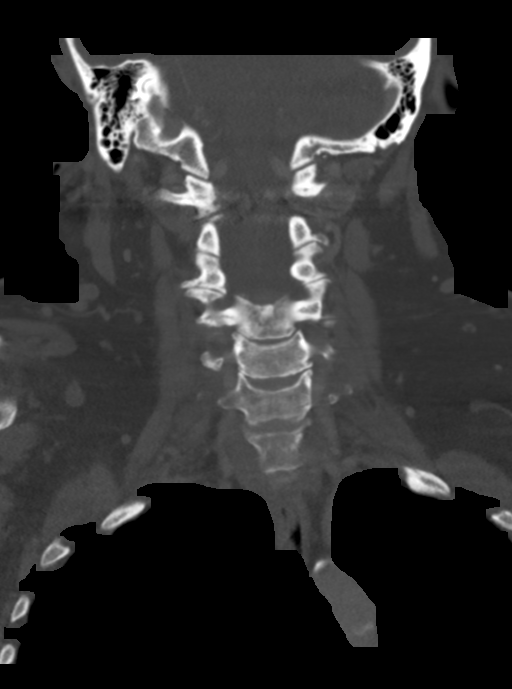

[Series 7: sagittal neck · sagittal · 0.39mm/px · 5 of 89 slices shown, 6 images]
[im 30/89  bone]
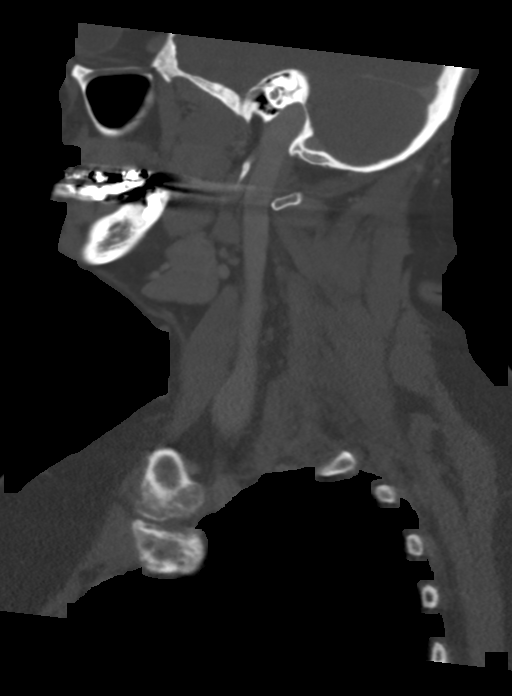
[im 37/89  bone]
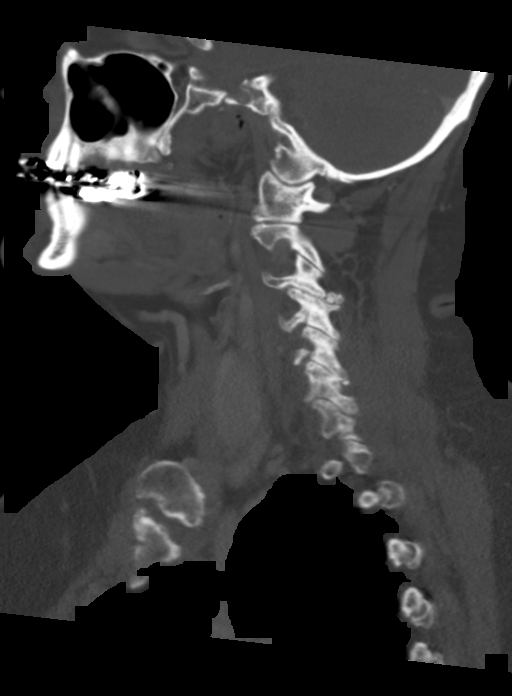
[im 45/89  soft-tissue]
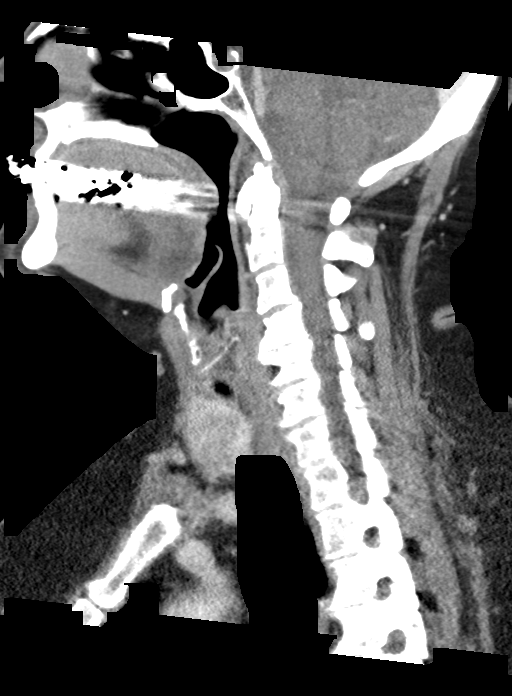
[im 45/89  bone]
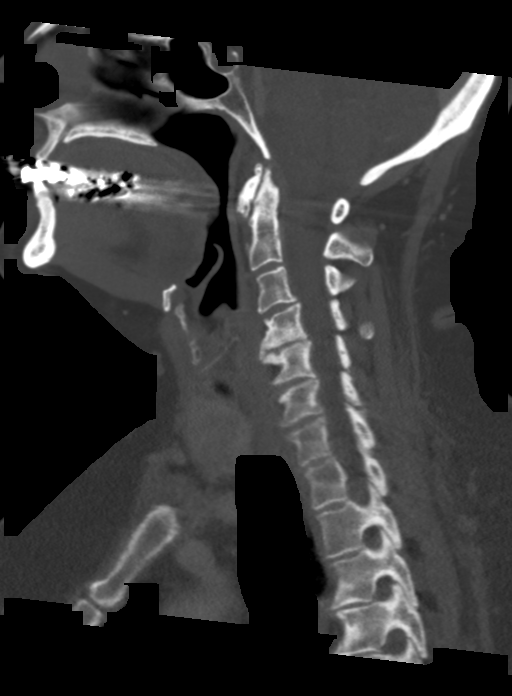
[im 52/89  bone]
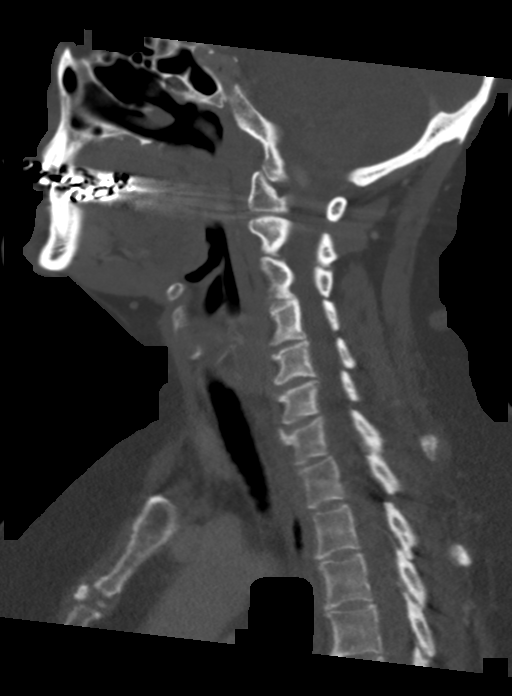
[im 59/89  bone]
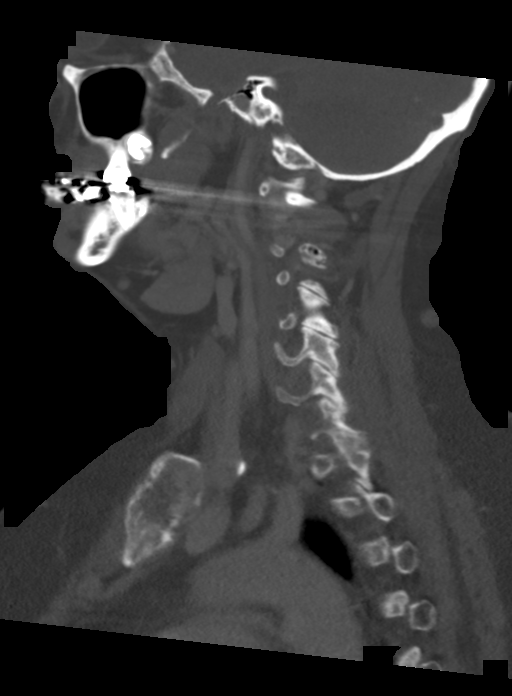

[13 of 33 positions shown; findings below may reference images not displayed]

FINDINGS: Pharynx and larynx: Normal. No mass or swelling.

Salivary glands: BB marks a palpable area of concern anterior to the
external auditory canal on the left. No mass lesion is seen in this
area. Normal-appearing parotid gland

Right parotid normal.  Bilateral submandibular glands normal

Thyroid: 17 mm ill-defined low-density nodule right lobe of the
thyroid. Additional nodule in the right isthmus measuring 11 mm

Lymph nodes: No enlarged or pathologic lymph nodes

Vascular: Negative

Limited intracranial: Negative

Visualized orbits: Negative

Mastoids and visualized paranasal sinuses: Negative

Skeleton: Multilevel disc and facet degeneration in the cervical
spine. No acute skeletal abnormality.

Upper chest: Negative

Other: None
IMPRESSION: Negative for mass or adenopathy. In the area of palpable concern on
the left, no cystic or solid mass is seen

## 2018-08-28 DIAGNOSIS — Z23 Encounter for immunization: Secondary | ICD-10-CM | POA: Diagnosis not present

## 2018-09-16 DIAGNOSIS — R5383 Other fatigue: Secondary | ICD-10-CM | POA: Diagnosis not present

## 2018-09-16 DIAGNOSIS — R69 Illness, unspecified: Secondary | ICD-10-CM | POA: Diagnosis not present

## 2018-09-16 DIAGNOSIS — E119 Type 2 diabetes mellitus without complications: Secondary | ICD-10-CM | POA: Diagnosis not present

## 2018-09-16 DIAGNOSIS — E039 Hypothyroidism, unspecified: Secondary | ICD-10-CM | POA: Diagnosis not present

## 2018-09-16 DIAGNOSIS — E7800 Pure hypercholesterolemia, unspecified: Secondary | ICD-10-CM | POA: Diagnosis not present

## 2018-09-17 DIAGNOSIS — E1165 Type 2 diabetes mellitus with hyperglycemia: Secondary | ICD-10-CM | POA: Diagnosis not present

## 2018-09-17 DIAGNOSIS — E782 Mixed hyperlipidemia: Secondary | ICD-10-CM | POA: Diagnosis not present

## 2018-09-17 DIAGNOSIS — E669 Obesity, unspecified: Secondary | ICD-10-CM | POA: Diagnosis not present

## 2018-09-20 DIAGNOSIS — Z6832 Body mass index (BMI) 32.0-32.9, adult: Secondary | ICD-10-CM | POA: Diagnosis not present

## 2018-09-20 DIAGNOSIS — R946 Abnormal results of thyroid function studies: Secondary | ICD-10-CM | POA: Diagnosis not present

## 2018-09-20 DIAGNOSIS — E782 Mixed hyperlipidemia: Secondary | ICD-10-CM | POA: Diagnosis not present

## 2018-09-20 DIAGNOSIS — L719 Rosacea, unspecified: Secondary | ICD-10-CM | POA: Diagnosis not present

## 2018-09-20 DIAGNOSIS — K116 Mucocele of salivary gland: Secondary | ICD-10-CM | POA: Diagnosis not present

## 2018-09-20 DIAGNOSIS — I10 Essential (primary) hypertension: Secondary | ICD-10-CM | POA: Diagnosis not present

## 2018-09-20 DIAGNOSIS — Z1389 Encounter for screening for other disorder: Secondary | ICD-10-CM | POA: Diagnosis not present

## 2018-09-20 DIAGNOSIS — E1165 Type 2 diabetes mellitus with hyperglycemia: Secondary | ICD-10-CM | POA: Diagnosis not present

## 2018-10-09 DIAGNOSIS — L570 Actinic keratosis: Secondary | ICD-10-CM | POA: Diagnosis not present

## 2018-10-09 DIAGNOSIS — L280 Lichen simplex chronicus: Secondary | ICD-10-CM | POA: Diagnosis not present

## 2018-11-04 DIAGNOSIS — R69 Illness, unspecified: Secondary | ICD-10-CM | POA: Diagnosis not present

## 2018-11-19 DIAGNOSIS — Z1231 Encounter for screening mammogram for malignant neoplasm of breast: Secondary | ICD-10-CM | POA: Diagnosis not present

## 2018-12-02 DIAGNOSIS — R69 Illness, unspecified: Secondary | ICD-10-CM | POA: Diagnosis not present

## 2018-12-30 DIAGNOSIS — R69 Illness, unspecified: Secondary | ICD-10-CM | POA: Diagnosis not present

## 2019-01-14 DIAGNOSIS — E782 Mixed hyperlipidemia: Secondary | ICD-10-CM | POA: Diagnosis not present

## 2019-01-14 DIAGNOSIS — E1165 Type 2 diabetes mellitus with hyperglycemia: Secondary | ICD-10-CM | POA: Diagnosis not present

## 2019-01-14 DIAGNOSIS — R946 Abnormal results of thyroid function studies: Secondary | ICD-10-CM | POA: Diagnosis not present

## 2019-01-14 DIAGNOSIS — I10 Essential (primary) hypertension: Secondary | ICD-10-CM | POA: Diagnosis not present

## 2019-01-17 DIAGNOSIS — R946 Abnormal results of thyroid function studies: Secondary | ICD-10-CM | POA: Diagnosis not present

## 2019-01-17 DIAGNOSIS — Z6833 Body mass index (BMI) 33.0-33.9, adult: Secondary | ICD-10-CM | POA: Diagnosis not present

## 2019-01-17 DIAGNOSIS — Z23 Encounter for immunization: Secondary | ICD-10-CM | POA: Diagnosis not present

## 2019-01-17 DIAGNOSIS — I10 Essential (primary) hypertension: Secondary | ICD-10-CM | POA: Diagnosis not present

## 2019-01-17 DIAGNOSIS — E1165 Type 2 diabetes mellitus with hyperglycemia: Secondary | ICD-10-CM | POA: Diagnosis not present

## 2019-01-17 DIAGNOSIS — K116 Mucocele of salivary gland: Secondary | ICD-10-CM | POA: Diagnosis not present

## 2019-01-17 DIAGNOSIS — E782 Mixed hyperlipidemia: Secondary | ICD-10-CM | POA: Diagnosis not present

## 2019-01-17 DIAGNOSIS — L719 Rosacea, unspecified: Secondary | ICD-10-CM | POA: Diagnosis not present

## 2019-01-22 DIAGNOSIS — R69 Illness, unspecified: Secondary | ICD-10-CM | POA: Diagnosis not present

## 2019-02-17 DIAGNOSIS — R69 Illness, unspecified: Secondary | ICD-10-CM | POA: Diagnosis not present

## 2019-03-15 DIAGNOSIS — R69 Illness, unspecified: Secondary | ICD-10-CM | POA: Diagnosis not present

## 2019-04-07 DIAGNOSIS — R69 Illness, unspecified: Secondary | ICD-10-CM | POA: Diagnosis not present

## 2019-05-01 DIAGNOSIS — D440 Neoplasm of uncertain behavior of thyroid gland: Secondary | ICD-10-CM | POA: Diagnosis not present

## 2019-05-05 DIAGNOSIS — R69 Illness, unspecified: Secondary | ICD-10-CM | POA: Diagnosis not present

## 2020-04-27 ENCOUNTER — Other Ambulatory Visit: Payer: Self-pay | Admitting: Otolaryngology

## 2020-04-27 ENCOUNTER — Other Ambulatory Visit (HOSPITAL_COMMUNITY): Payer: Self-pay | Admitting: Otolaryngology

## 2020-04-27 DIAGNOSIS — E042 Nontoxic multinodular goiter: Secondary | ICD-10-CM

## 2020-05-03 ENCOUNTER — Ambulatory Visit (HOSPITAL_COMMUNITY): Payer: Medicare HMO

## 2020-05-03 ENCOUNTER — Encounter (HOSPITAL_COMMUNITY): Payer: Self-pay

## 2020-05-10 ENCOUNTER — Other Ambulatory Visit: Payer: Self-pay

## 2020-05-10 ENCOUNTER — Ambulatory Visit (HOSPITAL_COMMUNITY)
Admission: RE | Admit: 2020-05-10 | Discharge: 2020-05-10 | Disposition: A | Discharge status: Home / Self Care | Payer: Medicare HMO | Source: Ambulatory Visit | Attending: Otolaryngology | Admitting: Otolaryngology

## 2020-05-10 DIAGNOSIS — E042 Nontoxic multinodular goiter: Secondary | ICD-10-CM | POA: Diagnosis not present
